# Patient Record
Sex: Female | Born: 1955 | Race: White | Hispanic: No | Marital: Single | Smoking: Never smoker
Health system: Southern US, Community
[De-identification: ages and names within clinical notes are randomized; demographics above are authoritative.]

---

## 2002-10-22 ENCOUNTER — Ambulatory Visit (HOSPITAL_COMMUNITY): Admission: RE | Admit: 2002-10-22 | Discharge: 2002-10-22 | Payer: Self-pay | Admitting: Family Medicine

## 2002-10-22 ENCOUNTER — Encounter: Payer: Self-pay | Admitting: Family Medicine

## 2017-04-28 ENCOUNTER — Other Ambulatory Visit: Payer: Self-pay | Admitting: Family Medicine

## 2017-04-28 ENCOUNTER — Ambulatory Visit
Admission: RE | Admit: 2017-04-28 | Discharge: 2017-04-28 | Disposition: A | Discharge status: Home / Self Care | Payer: BLUE CROSS/BLUE SHIELD | Source: Ambulatory Visit | Attending: Family Medicine | Admitting: Family Medicine

## 2017-04-28 DIAGNOSIS — M542 Cervicalgia: Secondary | ICD-10-CM

## 2017-08-18 ENCOUNTER — Other Ambulatory Visit: Payer: Self-pay | Admitting: Family Medicine

## 2017-08-18 DIAGNOSIS — R109 Unspecified abdominal pain: Secondary | ICD-10-CM

## 2017-10-06 ENCOUNTER — Ambulatory Visit
Admission: RE | Admit: 2017-10-06 | Discharge: 2017-10-06 | Disposition: A | Discharge status: Home / Self Care | Payer: PRIVATE HEALTH INSURANCE | Source: Ambulatory Visit | Attending: Family Medicine | Admitting: Family Medicine

## 2017-10-06 DIAGNOSIS — R109 Unspecified abdominal pain: Secondary | ICD-10-CM

## 2018-05-29 IMAGING — US US ABDOMEN COMPLETE
1 series · 13 of 25 positions shown · non-contrast
Comparison: No prior.

CLINICAL DATA: Left flank pain.

EXAM:
ABDOMEN ULTRASOUND COMPLETE

[Series 1: us abdomen complete · 0.22mm/px · 13 of 113 slices shown]
[im 1/113]
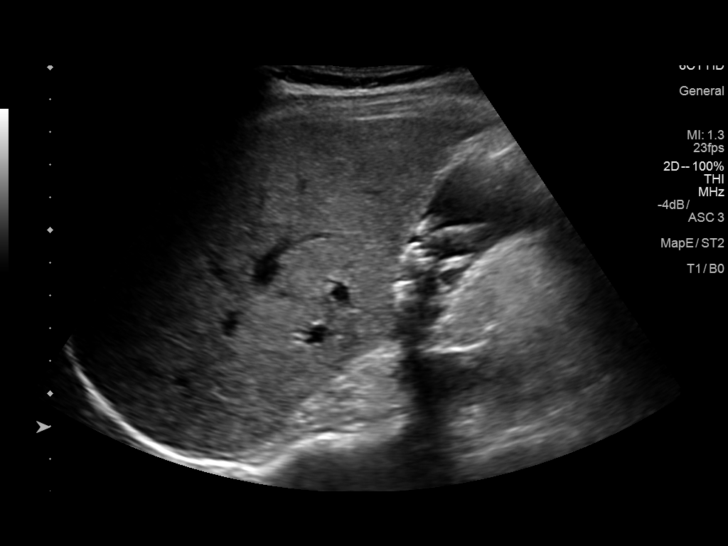
[im 10/113]
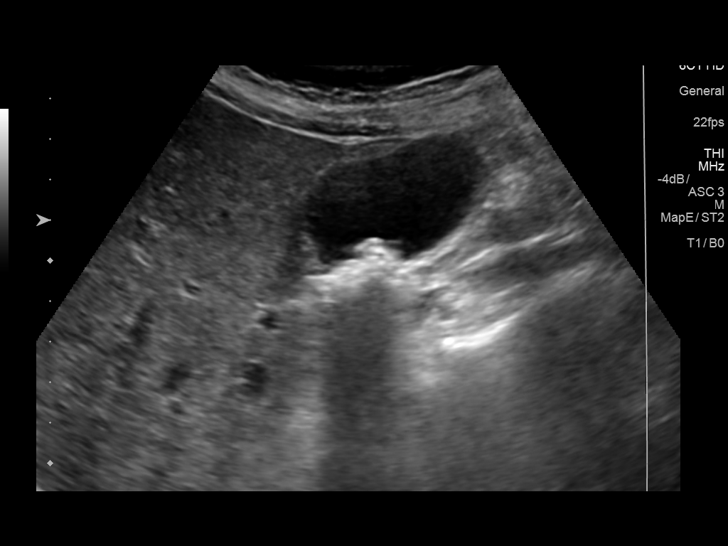
[im 19/113]
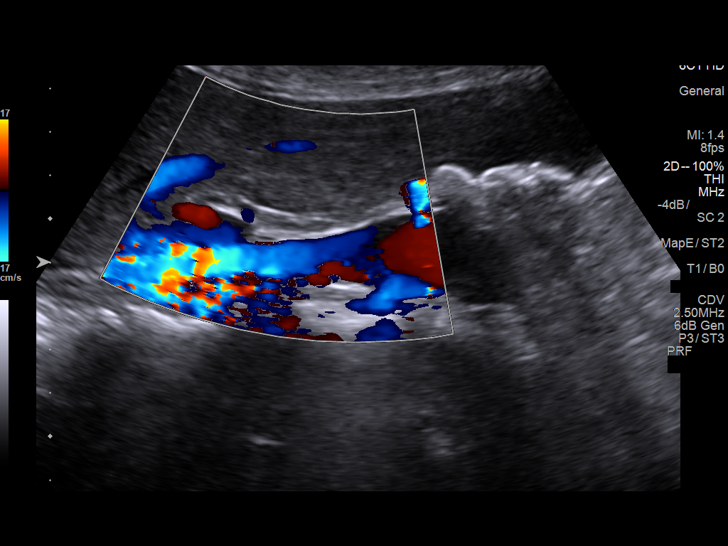
[im 29/113]
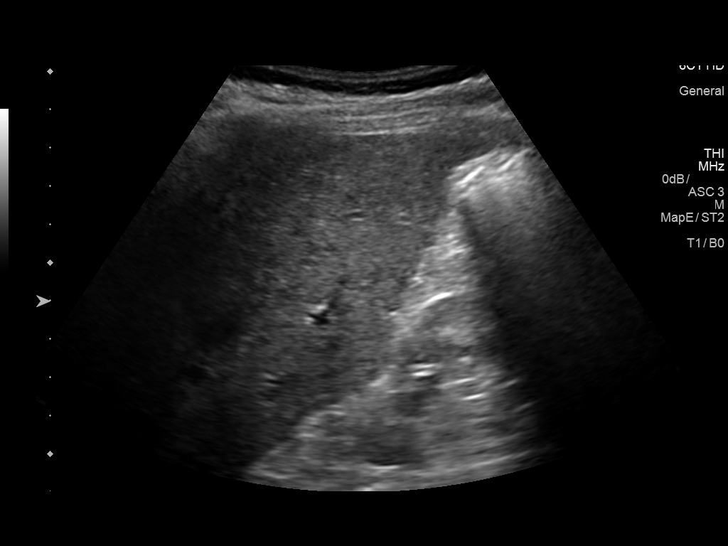
[im 38/113]
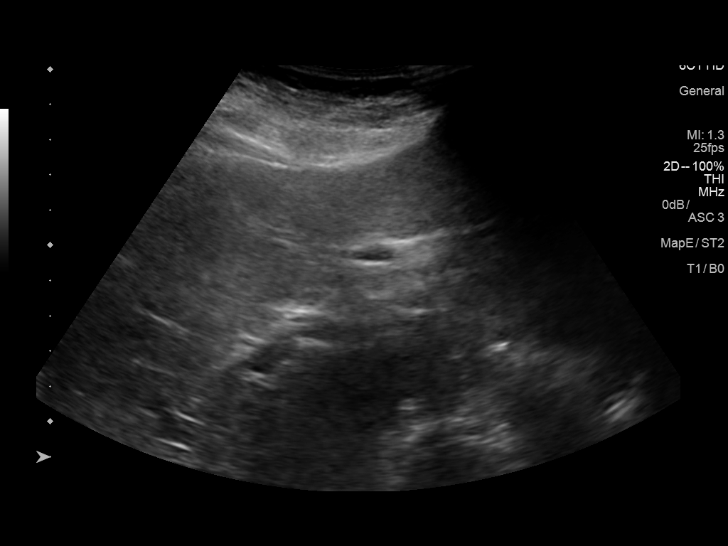
[im 47/113]
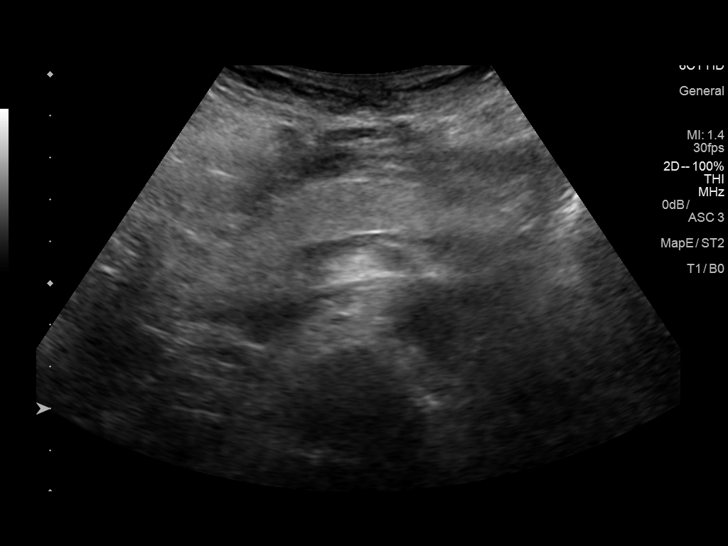
[im 57/113]
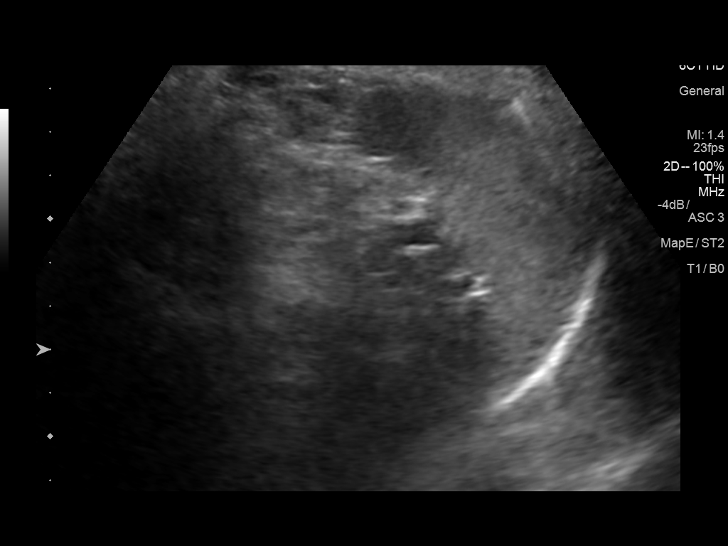
[im 66/113]
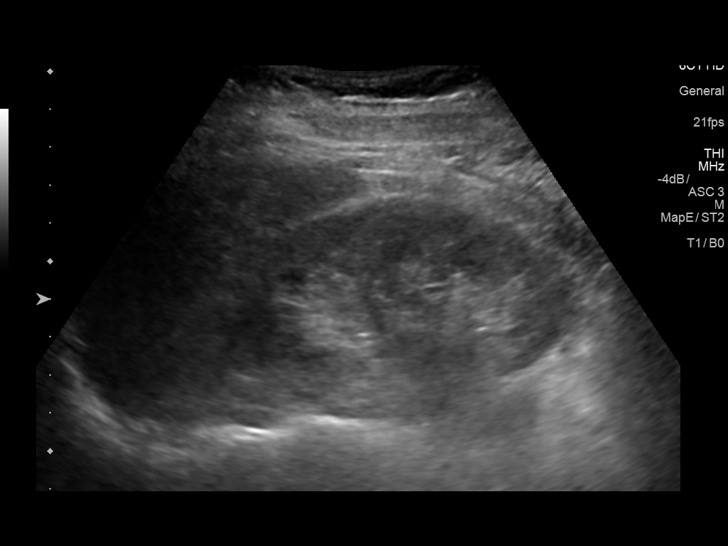
[im 75/113]
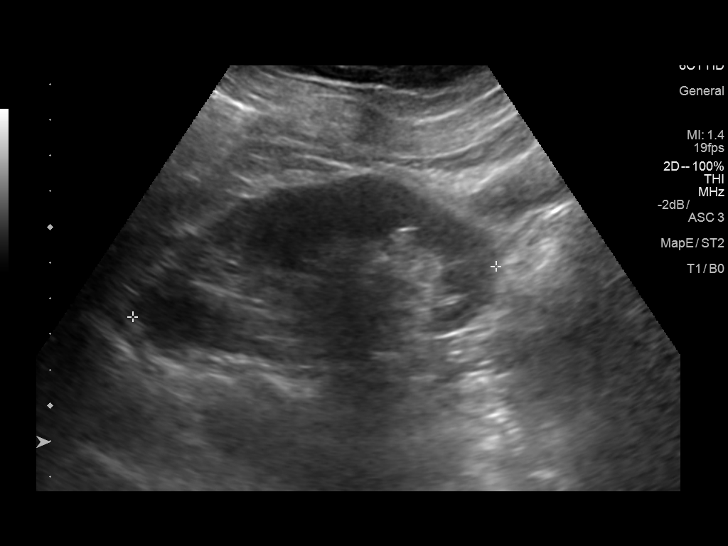
[im 85/113]
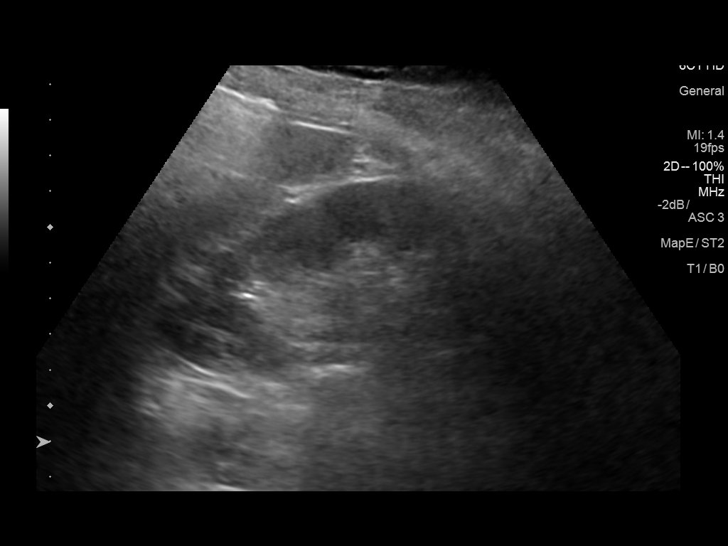
[im 94/113]
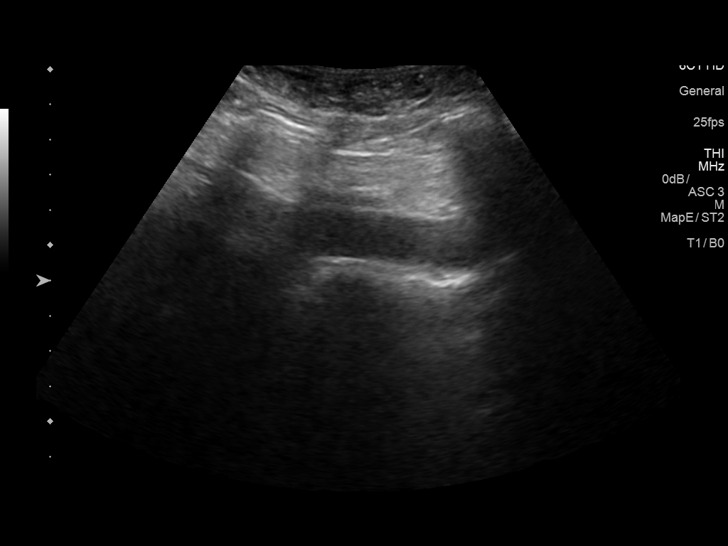
[im 103/113]
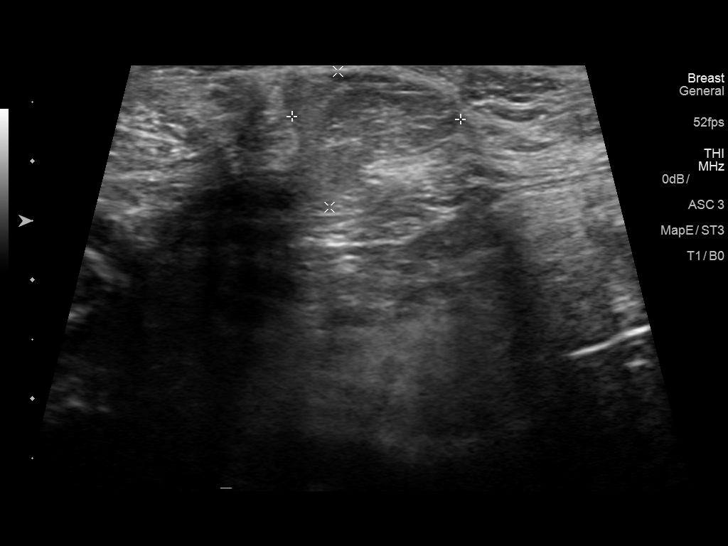
[im 113/113]
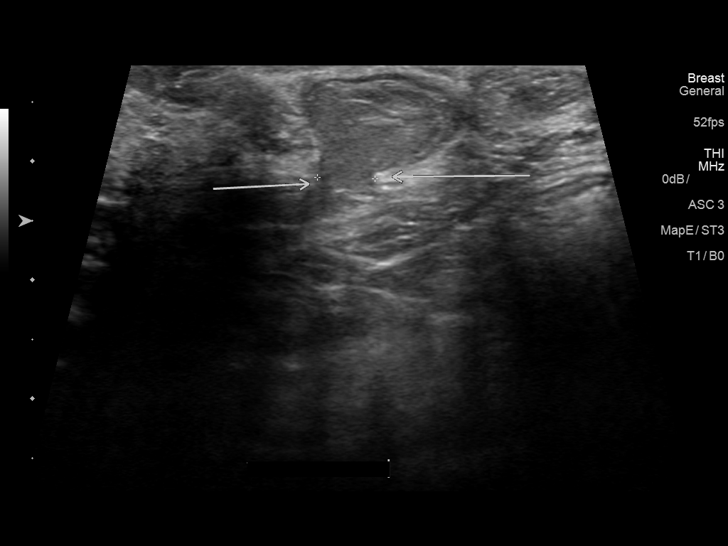

[13 of 25 positions shown; findings below may reference images not displayed]

FINDINGS: Gallbladder: Multiple gallstones. Gallbladder wall thickness 1.8 mm.
Negative Murphy sign.

Common bile duct: Diameter: 3.5 mm

Liver: No focal lesion identified. Within normal limits in
parenchymal echogenicity. Portal vein is patent on color Doppler
imaging with normal direction of blood flow towards the liver.

IVC: No abnormality visualized.

Pancreas: Visualized portion unremarkable.

Spleen: Size and appearance within normal limits.

Right Kidney: Length: 10.6 cm. Echogenicity within normal limits. No
mass or hydronephrosis visualized.

Left Kidney: Length: 10.4 cm. Echogenicity within normal limits. No
mass or hydronephrosis visualized.

Abdominal aorta: No aneurysm visualized.

Other findings: Noted in the anterior midline of the abdomen along
an anterior incision site is what appears to represent a midline
hernia with herniation of fat. Hernia neck appears be approximately
5 cm . Confirmation with CT can be obtained.
IMPRESSION: 1. Multiple gallstones. No evidence of cholecystitis or biliary
distention.

2. Probable anterior incision hernia with herniation of fat. Hernia
neck appears to be approximately 5 cm

## 2020-11-16 DIAGNOSIS — L821 Other seborrheic keratosis: Secondary | ICD-10-CM | POA: Diagnosis not present

## 2020-11-16 DIAGNOSIS — L570 Actinic keratosis: Secondary | ICD-10-CM | POA: Diagnosis not present

## 2020-11-16 DIAGNOSIS — L565 Disseminated superficial actinic porokeratosis (DSAP): Secondary | ICD-10-CM | POA: Diagnosis not present

## 2020-11-16 DIAGNOSIS — L814 Other melanin hyperpigmentation: Secondary | ICD-10-CM | POA: Diagnosis not present

## 2020-11-16 DIAGNOSIS — C44719 Basal cell carcinoma of skin of left lower limb, including hip: Secondary | ICD-10-CM | POA: Diagnosis not present

## 2020-11-16 DIAGNOSIS — D225 Melanocytic nevi of trunk: Secondary | ICD-10-CM | POA: Diagnosis not present

## 2020-11-16 DIAGNOSIS — D2272 Melanocytic nevi of left lower limb, including hip: Secondary | ICD-10-CM | POA: Diagnosis not present

## 2020-11-16 DIAGNOSIS — D2271 Melanocytic nevi of right lower limb, including hip: Secondary | ICD-10-CM | POA: Diagnosis not present

## 2020-11-16 DIAGNOSIS — Z85828 Personal history of other malignant neoplasm of skin: Secondary | ICD-10-CM | POA: Diagnosis not present

## 2021-03-11 DIAGNOSIS — H2512 Age-related nuclear cataract, left eye: Secondary | ICD-10-CM | POA: Diagnosis not present

## 2021-03-11 DIAGNOSIS — H25043 Posterior subcapsular polar age-related cataract, bilateral: Secondary | ICD-10-CM | POA: Diagnosis not present

## 2021-03-11 DIAGNOSIS — H18413 Arcus senilis, bilateral: Secondary | ICD-10-CM | POA: Diagnosis not present

## 2021-03-11 DIAGNOSIS — H25013 Cortical age-related cataract, bilateral: Secondary | ICD-10-CM | POA: Diagnosis not present

## 2021-03-11 DIAGNOSIS — H2513 Age-related nuclear cataract, bilateral: Secondary | ICD-10-CM | POA: Diagnosis not present

## 2021-06-07 DIAGNOSIS — H25012 Cortical age-related cataract, left eye: Secondary | ICD-10-CM | POA: Diagnosis not present

## 2021-06-07 DIAGNOSIS — H2512 Age-related nuclear cataract, left eye: Secondary | ICD-10-CM | POA: Diagnosis not present

## 2021-06-08 DIAGNOSIS — H2511 Age-related nuclear cataract, right eye: Secondary | ICD-10-CM | POA: Diagnosis not present

## 2021-06-28 DIAGNOSIS — H2511 Age-related nuclear cataract, right eye: Secondary | ICD-10-CM | POA: Diagnosis not present

## 2021-09-14 DIAGNOSIS — Z0000 Encounter for general adult medical examination without abnormal findings: Secondary | ICD-10-CM | POA: Diagnosis not present

## 2021-09-14 DIAGNOSIS — T7840XA Allergy, unspecified, initial encounter: Secondary | ICD-10-CM | POA: Diagnosis not present

## 2021-09-14 DIAGNOSIS — E559 Vitamin D deficiency, unspecified: Secondary | ICD-10-CM | POA: Diagnosis not present

## 2021-09-14 DIAGNOSIS — E785 Hyperlipidemia, unspecified: Secondary | ICD-10-CM | POA: Diagnosis not present

## 2021-09-14 DIAGNOSIS — M810 Age-related osteoporosis without current pathological fracture: Secondary | ICD-10-CM | POA: Diagnosis not present

## 2021-09-22 DIAGNOSIS — S63502A Unspecified sprain of left wrist, initial encounter: Secondary | ICD-10-CM | POA: Diagnosis not present

## 2021-09-28 DIAGNOSIS — M810 Age-related osteoporosis without current pathological fracture: Secondary | ICD-10-CM | POA: Diagnosis not present

## 2021-09-28 DIAGNOSIS — Z780 Asymptomatic menopausal state: Secondary | ICD-10-CM | POA: Diagnosis not present

## 2021-09-28 DIAGNOSIS — M8588 Other specified disorders of bone density and structure, other site: Secondary | ICD-10-CM | POA: Diagnosis not present

## 2021-09-28 DIAGNOSIS — Z1231 Encounter for screening mammogram for malignant neoplasm of breast: Secondary | ICD-10-CM | POA: Diagnosis not present

## 2021-10-12 DIAGNOSIS — M25531 Pain in right wrist: Secondary | ICD-10-CM | POA: Diagnosis not present

## 2021-10-12 DIAGNOSIS — M19032 Primary osteoarthritis, left wrist: Secondary | ICD-10-CM | POA: Diagnosis not present

## 2021-11-16 DIAGNOSIS — M25561 Pain in right knee: Secondary | ICD-10-CM | POA: Diagnosis not present

## 2021-11-16 DIAGNOSIS — M79662 Pain in left lower leg: Secondary | ICD-10-CM | POA: Diagnosis not present

## 2021-11-16 DIAGNOSIS — M170 Bilateral primary osteoarthritis of knee: Secondary | ICD-10-CM | POA: Diagnosis not present

## 2021-11-16 DIAGNOSIS — M25562 Pain in left knee: Secondary | ICD-10-CM | POA: Diagnosis not present

## 2021-11-17 DIAGNOSIS — L570 Actinic keratosis: Secondary | ICD-10-CM | POA: Diagnosis not present

## 2021-11-17 DIAGNOSIS — L814 Other melanin hyperpigmentation: Secondary | ICD-10-CM | POA: Diagnosis not present

## 2021-11-17 DIAGNOSIS — L565 Disseminated superficial actinic porokeratosis (DSAP): Secondary | ICD-10-CM | POA: Diagnosis not present

## 2021-11-17 DIAGNOSIS — Z85828 Personal history of other malignant neoplasm of skin: Secondary | ICD-10-CM | POA: Diagnosis not present

## 2021-11-17 DIAGNOSIS — D485 Neoplasm of uncertain behavior of skin: Secondary | ICD-10-CM | POA: Diagnosis not present

## 2021-11-17 DIAGNOSIS — D225 Melanocytic nevi of trunk: Secondary | ICD-10-CM | POA: Diagnosis not present

## 2021-11-17 DIAGNOSIS — L821 Other seborrheic keratosis: Secondary | ICD-10-CM | POA: Diagnosis not present

## 2021-11-17 DIAGNOSIS — D2271 Melanocytic nevi of right lower limb, including hip: Secondary | ICD-10-CM | POA: Diagnosis not present

## 2021-11-17 DIAGNOSIS — D2272 Melanocytic nevi of left lower limb, including hip: Secondary | ICD-10-CM | POA: Diagnosis not present

## 2021-11-30 ENCOUNTER — Ambulatory Visit: Payer: Medicare Other | Admitting: Allergy & Immunology

## 2021-11-30 ENCOUNTER — Other Ambulatory Visit: Payer: Self-pay

## 2021-11-30 ENCOUNTER — Encounter: Payer: Self-pay | Admitting: Allergy & Immunology

## 2021-11-30 VITALS — BP 142/72 | HR 98 | Temp 98.2°F | Resp 16 | Ht 61.81 in | Wt 125.0 lb

## 2021-11-30 DIAGNOSIS — Z888 Allergy status to other drugs, medicaments and biological substances status: Secondary | ICD-10-CM | POA: Diagnosis not present

## 2021-11-30 DIAGNOSIS — T50905D Adverse effect of unspecified drugs, medicaments and biological substances, subsequent encounter: Secondary | ICD-10-CM

## 2021-11-30 NOTE — Patient Instructions (Addendum)
1. Adverse effect of drug - Testing was negative to the flu vaccine and the [place] vaccine. - This means that you *should* be able to tolerate these without a problem. - I would make an appointment with a [place] vaccine graded challenge and then a influenza vaccine graded challenge. - You can do this in [employer] (or even here). - I think the influenza vaccine reaction is more likely related to the [place] since it took so long for the reaction to occur (drug reactions typically occurs in the first 30-60 minutes).   2. Return in about 2 weeks (around 12/14/2021) for [place].    Please inform us of any Emergency Department visits, hospitalizations, or changes in symptoms. Call us before going to the ED for breathing or allergy symptoms since we might be able to fit you in for a sick visit. Feel free to contact us anytime with any questions, problems, or concerns.  It was a pleasure to meet you today!  Websites that have reliable patient information: 1. American Academy of Asthma, Allergy, and Immunology: www.aaaai.org 2. Food Allergy Research and Education (FARE): foodallergy.org 3. Mothers of Asthmatics: http://www.asthmacommunitynetwork.org 4. American College of Allergy, Asthma, and Immunology: www.acaai.org   COVID-19 Vaccine Information can be found at: ShippingScam.co.uk For questions related to vaccine distribution or appointments, please email [email or web address] or call [phone].   We realize that you might be concerned about having an allergic reaction to the COVID19 vaccines. To help with that concern, WE ARE OFFERING THE COVID19 VACCINES IN OUR OFFICE! Ask the front desk for dates!     Like [place] on [employer] and Instagram for our latest updates!      A healthy democracy works best when [employer] participate! Make sure you are registered to vote! If you  have moved or changed any of your contact information, you will need to get this updated before voting!  In some cases, you MAY be able to register to vote online: [email or web address]

## 2021-11-30 NOTE — Progress Notes (Signed)
NEW PATIENT  Date of Service/Encounter:  11/30/21  Consult requested by: Harlan Stains, MD   Assessment:   Adverse effect of drug - with negative testing to the [place] vaccinations   Testing today was negative to the influenza and the COVID19 vaccination prick testing. This makes me feel better about proceeding with a direct observed challenge in the office. Her reaction to the influenza vaccine did not seem consistent with anaphylaxis given the long time course between the injection itself and the onset of symptoms.  She was also on Macrobid at that time, which is likely what triggered her widespread urticaria.  Regarding the COVID-19 vaccine, tachycardia alone is unlikely to be anaphylaxis and might just be a known side effect of the Moderna vaccine.  She does feel comfortable getting the [place] in our office to complete her COVID-19 series.   We used to do component testing to the COVID19 vaccines, but our practices have changed over time since this detailed testing did not really change our management for the most part. In fact, studies have shown that even full blow anaphylaxis reactions are not a complete contraindication to getting additional COVID19 vaccines.  The majority of the time, patient are able to receive the next vaccination without any problems at all.   Plan/Recommendations:   1. Adverse effect of drug - Testing was negative to the flu vaccine and the [place] vaccine. - This means that you *should* be able to tolerate these without a problem. - I would make an appointment with a [place] vaccine graded challenge and then a influenza vaccine graded challenge. - You can do this in [employer] (or even here). - I think the influenza vaccine reaction is more likely related to the [place] since it took so long for the reaction to occur (drug reactions typically occurs in the first 30-60 minutes).   2. Return in about 2 weeks (around  12/14/2021) for [place].    This note in its entirety was forwarded to the Provider who requested this consultation.  Subjective:   Anita Jackson is a 66 y.o. female presenting today for evaluation of  Chief Complaint  Patient presents with   Allergic Reaction    Past immunization reactions. COVID and Flu shots. Flu shot (2012/13) plus Macrobid had anaphylaxis. Covid reaction: had a raising heart beat for 24 hours. Referred here by PCP.     Allergic Rhinitis     Some seasonal allergies but nothing serious. Has more allergies to medications.    Anita STRAUSSER has a history of the following: Patient Active Problem List   Diagnosis Date Noted   Drug reaction 12/01/2021    History obtained from: chart review and patient.  [name] was referred by Harlan Stains, MD.     Anita Jackson is a 66 y.o. female presenting for an evaluation of vaccination allergies .  She had the flu shot and Macrobid at the same time. This was 2012 or 2013. 7-8 days afterwards, she developed anaphylaxis and went to the hospital. She has not had a flu shot since that time. She has avoided Macrobid since that time and she has not received a flu shot since that. She thinks that she was getting close to the end of the [place] course. She had never tolerated Macrobid prior to this to her knowledge. This was a UTI. She does not get frequent infections at all.   She also had a reaction  to a COVID booster. She had tachycardia for 24 hours. She had previously tolerated two previous reactions without a problem. She is wanting to get the booster, but she wanted this tested out first. She denies itching or coughing or swelling. She did "felt kinda drained" . She was not herself for one week. It was in the 120s or 130s, according to her FitBit. She could feel it pounding in her chest.   She has never had a problem with gelatin in the past. She does eat a lot of eggs without a problem.   She did have  a reaction to contrast dye. She has received premedications for this and been fine with it.   She is retired and previously worked for a [employer], [employer]. She did this for 30 years. 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 There is no significant infectious history. Vaccinations are up to date.    Past Medical History: Patient Active Problem List   Diagnosis Date Noted   Drug reaction 12/01/2021    Medication List:  Allergies as of 11/30/2021   Not on File      Medication List        Accurate as of November 30, 2021 11:59 PM. If you have any questions, ask your nurse or doctor.          meloxicam 15 MG tablet Commonly known as: MOBIC   MULTI-VITAMIN DAILY PO Multi Vitamin   Probiotic Chew See admin instructions. tablet   triamcinolone cream 0.1 % Commonly known as: KENALOG 1 application to affected area   vitamin C 1000 MG tablet 500 mg.   Vitamin K2-Vitamin D3 45-2000 MCG-UNIT Caps 2,000 mg.        Birth History: non-contributory  Developmental History: non-contributory  Past Surgical History: History reviewed. No pertinent surgical history.   Family History: Family History  Problem Relation Age of Onset   Allergic rhinitis Daughter      Social History: [name] lives at home with her family. She lives in a house that is 68 years old. There is luxury vinyl plank through much of the home and tiling in the bedroom. There are heat pumps for heating and cooling. There is one dog outside of the home. There are no dust mite coverings on the bedding. There is no tobacco exposure in the home. There is no fume, chemical, or dust exposure.    Review of Systems  Constitutional: Negative.  Negative for fever, malaise/fatigue and weight loss.  HENT: Negative.   Negative for congestion, ear discharge and ear pain.   Eyes:  Negative for pain, discharge and redness.  Respiratory:  Negative for cough, sputum production, shortness of breath and wheezing.   Cardiovascular: Negative.  Negative for chest pain and palpitations.  Gastrointestinal:  Negative for abdominal pain and heartburn.  Skin: Negative.  Negative for itching and rash.  Neurological:  Negative for dizziness and headaches.  Endo/Heme/Allergies:  Negative for environmental allergies. Does not bruise/bleed easily.      Objective:   Blood pressure (!) 142/72, pulse 98, temperature 98.2 F (36.8 C), temperature source Temporal, resp. rate 16, height 5' 1.81" (1.57 m), weight 125 lb (56.7 kg), SpO2 96 %. Body mass index is 23 kg/m.   Physical Exam:   Physical Exam Vitals reviewed.  Constitutional:      Appearance: She is well-developed.  Comments: Lovely and talkative.  HENT:     Head: Normocephalic and atraumatic.     Right Ear: Tympanic membrane, ear canal and external ear normal. No drainage, swelling or tenderness. Tympanic membrane is not injected, scarred, erythematous, retracted or bulging.     Left Ear: Tympanic membrane, ear canal and external ear normal. No drainage, swelling or tenderness. Tympanic membrane is not injected, scarred, erythematous, retracted or bulging.     Nose: No nasal deformity, septal deviation, mucosal edema or rhinorrhea.     Right Sinus: No maxillary sinus tenderness or frontal sinus tenderness.     Left Sinus: No maxillary sinus tenderness or frontal sinus tenderness.     Mouth/Throat:     Mouth: Mucous membranes are not pale and not dry.     Pharynx: Uvula midline.  Eyes:     General:        Right eye: No discharge.        Left eye: No discharge.     Conjunctiva/sclera: Conjunctivae normal.     Right eye: Right conjunctiva is not injected. No chemosis.    Left eye: Left conjunctiva is not injected. No chemosis.    Pupils: Pupils are  equal, round, and reactive to light.  Cardiovascular:     Rate and Rhythm: Normal rate and regular rhythm.     Heart sounds: Normal heart sounds.  Pulmonary:     Effort: Pulmonary effort is normal. No tachypnea, accessory muscle usage or respiratory distress.     Breath sounds: Normal breath sounds. No wheezing, rhonchi or rales.  Chest:     Chest wall: No tenderness.  Abdominal:     Tenderness: There is no abdominal tenderness. There is no guarding or rebound.  Lymphadenopathy:     Head:     Right side of head: No submandibular, tonsillar or occipital adenopathy.     Left side of head: No submandibular, tonsillar or occipital adenopathy.     Cervical: No cervical adenopathy.  Skin:    General: Skin is warm.     Capillary Refill: Capillary refill takes less than 2 seconds.     Coloration: Skin is not pale.     Findings: No abrasion, erythema, petechiae or rash. Rash is not papular, urticarial or vesicular.  Neurological:     Mental Status: She is alert.  Psychiatric:        Behavior: Behavior is cooperative.     Diagnostic studies:    Allergy Studies:     Airborne Adult Perc - 11/30/21 1420     Time Antigen Placed 1420    Allergen Manufacturer Other   Influenza, Pfizer   Location Arm    Number of Test 4    Panel 1 Select    1. Control-Buffer 50% Glycerol Negative    2. Control-Histamine 1 mg/ml 2+    3. Albumin saline Omitted    Other Negative   Pfizer   Other Negative   Influenza            Allergy testing results were read and interpreted by myself, documented by clinical staff.         Salvatore Marvel, MD [facility]

## 2021-12-01 ENCOUNTER — Encounter: Payer: Self-pay | Admitting: Allergy & Immunology

## 2021-12-01 DIAGNOSIS — T50905A Adverse effect of unspecified drugs, medicaments and biological substances, initial encounter: Secondary | ICD-10-CM | POA: Insufficient documentation

## 2021-12-14 DIAGNOSIS — M65832 Other synovitis and tenosynovitis, left forearm: Secondary | ICD-10-CM | POA: Diagnosis not present

## 2021-12-14 DIAGNOSIS — M25531 Pain in right wrist: Secondary | ICD-10-CM | POA: Diagnosis not present

## 2021-12-14 DIAGNOSIS — M1811 Unilateral primary osteoarthritis of first carpometacarpal joint, right hand: Secondary | ICD-10-CM | POA: Diagnosis not present

## 2021-12-14 DIAGNOSIS — M19032 Primary osteoarthritis, left wrist: Secondary | ICD-10-CM | POA: Diagnosis not present

## 2021-12-14 DIAGNOSIS — M1812 Unilateral primary osteoarthritis of first carpometacarpal joint, left hand: Secondary | ICD-10-CM | POA: Diagnosis not present

## 2022-01-09 DIAGNOSIS — R82998 Other abnormal findings in urine: Secondary | ICD-10-CM | POA: Diagnosis not present

## 2022-01-09 DIAGNOSIS — N3001 Acute cystitis with hematuria: Secondary | ICD-10-CM | POA: Diagnosis not present

## 2022-02-15 DIAGNOSIS — M19032 Primary osteoarthritis, left wrist: Secondary | ICD-10-CM | POA: Diagnosis not present

## 2022-02-15 DIAGNOSIS — M1812 Unilateral primary osteoarthritis of first carpometacarpal joint, left hand: Secondary | ICD-10-CM | POA: Diagnosis not present

## 2022-02-15 DIAGNOSIS — M65832 Other synovitis and tenosynovitis, left forearm: Secondary | ICD-10-CM | POA: Diagnosis not present

## 2022-02-15 DIAGNOSIS — M25532 Pain in left wrist: Secondary | ICD-10-CM | POA: Diagnosis not present

## 2022-02-15 DIAGNOSIS — M25531 Pain in right wrist: Secondary | ICD-10-CM | POA: Diagnosis not present

## 2022-02-15 DIAGNOSIS — M1811 Unilateral primary osteoarthritis of first carpometacarpal joint, right hand: Secondary | ICD-10-CM | POA: Diagnosis not present

## 2022-02-23 DIAGNOSIS — K8020 Calculus of gallbladder without cholecystitis without obstruction: Secondary | ICD-10-CM | POA: Diagnosis not present

## 2022-02-23 DIAGNOSIS — M25532 Pain in left wrist: Secondary | ICD-10-CM | POA: Diagnosis not present

## 2022-02-23 DIAGNOSIS — M1812 Unilateral primary osteoarthritis of first carpometacarpal joint, left hand: Secondary | ICD-10-CM | POA: Diagnosis not present

## 2022-02-23 DIAGNOSIS — S63512A Sprain of carpal joint of left wrist, initial encounter: Secondary | ICD-10-CM | POA: Diagnosis not present

## 2022-02-23 DIAGNOSIS — M65832 Other synovitis and tenosynovitis, left forearm: Secondary | ICD-10-CM | POA: Diagnosis not present

## 2022-03-29 DIAGNOSIS — M25532 Pain in left wrist: Secondary | ICD-10-CM | POA: Diagnosis not present

## 2022-03-29 DIAGNOSIS — M1812 Unilateral primary osteoarthritis of first carpometacarpal joint, left hand: Secondary | ICD-10-CM | POA: Diagnosis not present

## 2022-03-29 DIAGNOSIS — M65832 Other synovitis and tenosynovitis, left forearm: Secondary | ICD-10-CM | POA: Diagnosis not present

## 2022-04-01 DIAGNOSIS — M25532 Pain in left wrist: Secondary | ICD-10-CM | POA: Diagnosis not present

## 2022-04-01 DIAGNOSIS — M1812 Unilateral primary osteoarthritis of first carpometacarpal joint, left hand: Secondary | ICD-10-CM | POA: Diagnosis not present

## 2022-05-17 DIAGNOSIS — M1812 Unilateral primary osteoarthritis of first carpometacarpal joint, left hand: Secondary | ICD-10-CM | POA: Diagnosis not present

## 2022-05-17 DIAGNOSIS — M25532 Pain in left wrist: Secondary | ICD-10-CM | POA: Diagnosis not present

## 2022-05-17 DIAGNOSIS — M65832 Other synovitis and tenosynovitis, left forearm: Secondary | ICD-10-CM | POA: Diagnosis not present

## 2022-06-23 DIAGNOSIS — L565 Disseminated superficial actinic porokeratosis (DSAP): Secondary | ICD-10-CM | POA: Diagnosis not present

## 2022-06-27 DIAGNOSIS — M19032 Primary osteoarthritis, left wrist: Secondary | ICD-10-CM | POA: Diagnosis not present

## 2022-09-26 DIAGNOSIS — M1812 Unilateral primary osteoarthritis of first carpometacarpal joint, left hand: Secondary | ICD-10-CM | POA: Diagnosis not present

## 2022-09-26 DIAGNOSIS — M19032 Primary osteoarthritis, left wrist: Secondary | ICD-10-CM | POA: Diagnosis not present

## 2022-09-27 DIAGNOSIS — R030 Elevated blood-pressure reading, without diagnosis of hypertension: Secondary | ICD-10-CM | POA: Diagnosis not present

## 2022-09-27 DIAGNOSIS — M810 Age-related osteoporosis without current pathological fracture: Secondary | ICD-10-CM | POA: Diagnosis not present

## 2022-09-27 DIAGNOSIS — E785 Hyperlipidemia, unspecified: Secondary | ICD-10-CM | POA: Diagnosis not present

## 2022-09-27 DIAGNOSIS — E559 Vitamin D deficiency, unspecified: Secondary | ICD-10-CM | POA: Diagnosis not present

## 2022-09-27 DIAGNOSIS — Z0000 Encounter for general adult medical examination without abnormal findings: Secondary | ICD-10-CM | POA: Diagnosis not present

## 2022-10-09 DIAGNOSIS — R509 Fever, unspecified: Secondary | ICD-10-CM | POA: Diagnosis not present

## 2022-10-09 DIAGNOSIS — B338 Other specified viral diseases: Secondary | ICD-10-CM | POA: Diagnosis not present

## 2022-10-09 DIAGNOSIS — Z20822 Contact with and (suspected) exposure to covid-19: Secondary | ICD-10-CM | POA: Diagnosis not present

## 2022-11-14 DIAGNOSIS — Z1231 Encounter for screening mammogram for malignant neoplasm of breast: Secondary | ICD-10-CM | POA: Diagnosis not present

## 2022-11-23 DIAGNOSIS — L821 Other seborrheic keratosis: Secondary | ICD-10-CM | POA: Diagnosis not present

## 2022-11-23 DIAGNOSIS — L570 Actinic keratosis: Secondary | ICD-10-CM | POA: Diagnosis not present

## 2022-11-23 DIAGNOSIS — D2261 Melanocytic nevi of right upper limb, including shoulder: Secondary | ICD-10-CM | POA: Diagnosis not present

## 2022-11-23 DIAGNOSIS — D225 Melanocytic nevi of trunk: Secondary | ICD-10-CM | POA: Diagnosis not present

## 2022-11-23 DIAGNOSIS — L565 Disseminated superficial actinic porokeratosis (DSAP): Secondary | ICD-10-CM | POA: Diagnosis not present

## 2022-11-23 DIAGNOSIS — D2271 Melanocytic nevi of right lower limb, including hip: Secondary | ICD-10-CM | POA: Diagnosis not present

## 2022-11-23 DIAGNOSIS — D2239 Melanocytic nevi of other parts of face: Secondary | ICD-10-CM | POA: Diagnosis not present

## 2022-11-23 DIAGNOSIS — D2262 Melanocytic nevi of left upper limb, including shoulder: Secondary | ICD-10-CM | POA: Diagnosis not present

## 2022-11-23 DIAGNOSIS — Z85828 Personal history of other malignant neoplasm of skin: Secondary | ICD-10-CM | POA: Diagnosis not present

## 2022-11-23 DIAGNOSIS — D2272 Melanocytic nevi of left lower limb, including hip: Secondary | ICD-10-CM | POA: Diagnosis not present

## 2022-11-23 DIAGNOSIS — L814 Other melanin hyperpigmentation: Secondary | ICD-10-CM | POA: Diagnosis not present

## 2022-12-15 DIAGNOSIS — H18413 Arcus senilis, bilateral: Secondary | ICD-10-CM | POA: Diagnosis not present

## 2022-12-15 DIAGNOSIS — H02831 Dermatochalasis of right upper eyelid: Secondary | ICD-10-CM | POA: Diagnosis not present

## 2022-12-15 DIAGNOSIS — H26493 Other secondary cataract, bilateral: Secondary | ICD-10-CM | POA: Diagnosis not present

## 2022-12-15 DIAGNOSIS — H26492 Other secondary cataract, left eye: Secondary | ICD-10-CM | POA: Diagnosis not present

## 2022-12-15 DIAGNOSIS — Z961 Presence of intraocular lens: Secondary | ICD-10-CM | POA: Diagnosis not present

## 2022-12-21 DIAGNOSIS — Z86010 Personal history of colonic polyps: Secondary | ICD-10-CM | POA: Diagnosis not present

## 2022-12-21 DIAGNOSIS — Z09 Encounter for follow-up examination after completed treatment for conditions other than malignant neoplasm: Secondary | ICD-10-CM | POA: Diagnosis not present

## 2022-12-21 DIAGNOSIS — K5730 Diverticulosis of large intestine without perforation or abscess without bleeding: Secondary | ICD-10-CM | POA: Diagnosis not present

## 2022-12-21 DIAGNOSIS — Z1211 Encounter for screening for malignant neoplasm of colon: Secondary | ICD-10-CM | POA: Diagnosis not present

## 2022-12-22 DIAGNOSIS — H26491 Other secondary cataract, right eye: Secondary | ICD-10-CM | POA: Diagnosis not present

## 2023-01-03 DIAGNOSIS — M19032 Primary osteoarthritis, left wrist: Secondary | ICD-10-CM | POA: Diagnosis not present

## 2023-04-04 DIAGNOSIS — M19032 Primary osteoarthritis, left wrist: Secondary | ICD-10-CM | POA: Diagnosis not present

## 2023-06-06 DIAGNOSIS — R21 Rash and other nonspecific skin eruption: Secondary | ICD-10-CM | POA: Diagnosis not present

## 2023-06-06 DIAGNOSIS — B029 Zoster without complications: Secondary | ICD-10-CM | POA: Diagnosis not present

## 2023-08-09 DIAGNOSIS — M19032 Primary osteoarthritis, left wrist: Secondary | ICD-10-CM | POA: Diagnosis not present

## 2023-09-13 DIAGNOSIS — S0590XA Unspecified injury of unspecified eye and orbit, initial encounter: Secondary | ICD-10-CM | POA: Diagnosis not present

## 2023-10-16 DIAGNOSIS — J40 Bronchitis, not specified as acute or chronic: Secondary | ICD-10-CM | POA: Diagnosis not present

## 2023-10-16 DIAGNOSIS — J069 Acute upper respiratory infection, unspecified: Secondary | ICD-10-CM | POA: Diagnosis not present

## 2023-10-16 DIAGNOSIS — M810 Age-related osteoporosis without current pathological fracture: Secondary | ICD-10-CM | POA: Diagnosis not present

## 2023-10-16 DIAGNOSIS — E785 Hyperlipidemia, unspecified: Secondary | ICD-10-CM | POA: Diagnosis not present

## 2023-10-16 DIAGNOSIS — E559 Vitamin D deficiency, unspecified: Secondary | ICD-10-CM | POA: Diagnosis not present

## 2023-10-16 DIAGNOSIS — R7301 Impaired fasting glucose: Secondary | ICD-10-CM | POA: Diagnosis not present

## 2023-10-16 DIAGNOSIS — Z0000 Encounter for general adult medical examination without abnormal findings: Secondary | ICD-10-CM | POA: Diagnosis not present

## 2023-11-20 DIAGNOSIS — Z1231 Encounter for screening mammogram for malignant neoplasm of breast: Secondary | ICD-10-CM | POA: Diagnosis not present

## 2023-11-20 DIAGNOSIS — M8588 Other specified disorders of bone density and structure, other site: Secondary | ICD-10-CM | POA: Diagnosis not present

## 2023-11-27 DIAGNOSIS — D225 Melanocytic nevi of trunk: Secondary | ICD-10-CM | POA: Diagnosis not present

## 2023-11-27 DIAGNOSIS — L570 Actinic keratosis: Secondary | ICD-10-CM | POA: Diagnosis not present

## 2023-11-27 DIAGNOSIS — L814 Other melanin hyperpigmentation: Secondary | ICD-10-CM | POA: Diagnosis not present

## 2023-11-27 DIAGNOSIS — Q828 Other specified congenital malformations of skin: Secondary | ICD-10-CM | POA: Diagnosis not present

## 2023-11-27 DIAGNOSIS — Z85828 Personal history of other malignant neoplasm of skin: Secondary | ICD-10-CM | POA: Diagnosis not present

## 2023-11-27 DIAGNOSIS — L821 Other seborrheic keratosis: Secondary | ICD-10-CM | POA: Diagnosis not present

## 2023-11-27 DIAGNOSIS — D485 Neoplasm of uncertain behavior of skin: Secondary | ICD-10-CM | POA: Diagnosis not present

## 2023-12-09 DIAGNOSIS — S61012A Laceration without foreign body of left thumb without damage to nail, initial encounter: Secondary | ICD-10-CM | POA: Diagnosis not present

## 2024-02-07 DIAGNOSIS — M19032 Primary osteoarthritis, left wrist: Secondary | ICD-10-CM | POA: Diagnosis not present

## 2024-04-15 DIAGNOSIS — B029 Zoster without complications: Secondary | ICD-10-CM | POA: Diagnosis not present

## 2024-04-22 DIAGNOSIS — M810 Age-related osteoporosis without current pathological fracture: Secondary | ICD-10-CM | POA: Diagnosis not present

## 2024-04-23 ENCOUNTER — Other Ambulatory Visit: Payer: Self-pay | Admitting: Family Medicine

## 2024-04-23 DIAGNOSIS — R102 Pelvic and perineal pain: Secondary | ICD-10-CM

## 2024-04-25 ENCOUNTER — Ambulatory Visit
Admission: RE | Admit: 2024-04-25 | Discharge: 2024-04-25 | Disposition: A | Discharge status: Home / Self Care | Source: Ambulatory Visit | Attending: Family Medicine | Admitting: Family Medicine

## 2024-04-25 DIAGNOSIS — R9389 Abnormal findings on diagnostic imaging of other specified body structures: Secondary | ICD-10-CM | POA: Diagnosis not present

## 2024-04-25 DIAGNOSIS — R102 Pelvic and perineal pain: Secondary | ICD-10-CM

## 2024-04-25 DIAGNOSIS — R1032 Left lower quadrant pain: Secondary | ICD-10-CM | POA: Diagnosis not present

## 2024-05-15 ENCOUNTER — Other Ambulatory Visit: Payer: Self-pay | Admitting: Family Medicine

## 2024-05-15 DIAGNOSIS — N839 Noninflammatory disorder of ovary, fallopian tube and broad ligament, unspecified: Secondary | ICD-10-CM

## 2024-05-31 DIAGNOSIS — N3090 Cystitis, unspecified without hematuria: Secondary | ICD-10-CM | POA: Diagnosis not present

## 2024-05-31 DIAGNOSIS — R300 Dysuria: Secondary | ICD-10-CM | POA: Diagnosis not present

## 2024-06-14 ENCOUNTER — Ambulatory Visit
Admission: RE | Admit: 2024-06-14 | Discharge: 2024-06-14 | Disposition: A | Discharge status: Home / Self Care | Source: Ambulatory Visit | Attending: Family Medicine | Admitting: Family Medicine

## 2024-06-14 DIAGNOSIS — K5730 Diverticulosis of large intestine without perforation or abscess without bleeding: Secondary | ICD-10-CM | POA: Diagnosis not present

## 2024-06-14 DIAGNOSIS — N839 Noninflammatory disorder of ovary, fallopian tube and broad ligament, unspecified: Secondary | ICD-10-CM

## 2024-06-14 MED ORDER — GADOPICLENOL 0.5 MMOL/ML IV SOLN
6.0000 mL | Freq: Once | INTRAVENOUS | Status: AC | PRN
  Administered 2024-06-14: 6 mL via INTRAVENOUS

## 2024-10-25 ENCOUNTER — Other Ambulatory Visit: Payer: Self-pay | Admitting: Family Medicine

## 2024-10-25 DIAGNOSIS — R935 Abnormal findings on diagnostic imaging of other abdominal regions, including retroperitoneum: Secondary | ICD-10-CM

## 2024-11-11 ENCOUNTER — Ambulatory Visit
Admission: RE | Admit: 2024-11-11 | Discharge: 2024-11-11 | Disposition: A | Discharge status: Home / Self Care | Source: Ambulatory Visit | Attending: Family Medicine | Admitting: Family Medicine

## 2024-11-11 DIAGNOSIS — R935 Abnormal findings on diagnostic imaging of other abdominal regions, including retroperitoneum: Secondary | ICD-10-CM
